# Patient Record
Sex: Male | Born: 1975 | Race: Black or African American | Hispanic: No | Marital: Married | Smoking: Never smoker
Health system: Southern US, Community
[De-identification: ages and names within clinical notes are randomized; demographics above are authoritative.]

---

## 1998-12-01 ENCOUNTER — Emergency Department (HOSPITAL_COMMUNITY): Admission: EM | Admit: 1998-12-01 | Discharge: 1998-12-01 | Payer: Self-pay | Admitting: Emergency Medicine

## 1998-12-02 ENCOUNTER — Encounter: Payer: Self-pay | Admitting: Emergency Medicine

## 1998-12-04 ENCOUNTER — Emergency Department (HOSPITAL_COMMUNITY): Admission: EM | Admit: 1998-12-04 | Discharge: 1998-12-04 | Payer: Self-pay | Admitting: Emergency Medicine

## 2004-01-08 ENCOUNTER — Emergency Department (HOSPITAL_COMMUNITY): Admission: EM | Admit: 2004-01-08 | Discharge: 2004-01-08 | Payer: Self-pay | Admitting: Emergency Medicine

## 2004-02-11 ENCOUNTER — Emergency Department (HOSPITAL_COMMUNITY): Admission: EM | Admit: 2004-02-11 | Discharge: 2004-02-12 | Payer: Self-pay

## 2006-02-10 ENCOUNTER — Emergency Department (HOSPITAL_COMMUNITY): Admission: EM | Admit: 2006-02-10 | Discharge: 2006-02-10 | Payer: Self-pay | Admitting: Emergency Medicine

## 2007-10-05 ENCOUNTER — Emergency Department (HOSPITAL_COMMUNITY): Admission: EM | Admit: 2007-10-05 | Discharge: 2007-10-05 | Payer: Self-pay | Admitting: Emergency Medicine

## 2009-06-06 ENCOUNTER — Emergency Department (HOSPITAL_COMMUNITY): Admission: EM | Admit: 2009-06-06 | Discharge: 2009-06-06 | Payer: Self-pay | Admitting: Emergency Medicine

## 2011-02-15 ENCOUNTER — Emergency Department (HOSPITAL_COMMUNITY)
Admission: EM | Admit: 2011-02-15 | Discharge: 2011-02-16 | Disposition: A | Discharge status: Home / Self Care | Payer: Self-pay | Attending: Emergency Medicine | Admitting: Emergency Medicine

## 2011-02-15 ENCOUNTER — Emergency Department (HOSPITAL_COMMUNITY): Payer: Self-pay

## 2011-02-15 DIAGNOSIS — F17200 Nicotine dependence, unspecified, uncomplicated: Secondary | ICD-10-CM | POA: Insufficient documentation

## 2011-02-15 DIAGNOSIS — R000 Tachycardia, unspecified: Secondary | ICD-10-CM | POA: Insufficient documentation

## 2011-02-15 DIAGNOSIS — R062 Wheezing: Secondary | ICD-10-CM | POA: Insufficient documentation

## 2011-02-15 DIAGNOSIS — R0602 Shortness of breath: Secondary | ICD-10-CM | POA: Insufficient documentation

## 2011-10-08 ENCOUNTER — Emergency Department (HOSPITAL_COMMUNITY)
Admission: EM | Admit: 2011-10-08 | Discharge: 2011-10-08 | Disposition: A | Discharge status: Home / Self Care | Payer: Self-pay | Attending: Emergency Medicine | Admitting: Emergency Medicine

## 2011-10-08 ENCOUNTER — Emergency Department (HOSPITAL_COMMUNITY): Payer: Self-pay

## 2011-10-08 DIAGNOSIS — M79609 Pain in unspecified limb: Secondary | ICD-10-CM | POA: Insufficient documentation

## 2011-10-08 DIAGNOSIS — M79675 Pain in left toe(s): Secondary | ICD-10-CM

## 2011-10-08 DIAGNOSIS — R609 Edema, unspecified: Secondary | ICD-10-CM | POA: Insufficient documentation

## 2011-10-08 MED ORDER — OXYCODONE-ACETAMINOPHEN 5-325 MG PO TABS
1.0000 | ORAL_TABLET | Freq: Once | ORAL | Status: AC
  Administered 2011-10-08: 1 via ORAL
  Filled 2011-10-08: qty 1

## 2011-10-08 MED ORDER — OXYCODONE-ACETAMINOPHEN 5-325 MG PO TABS: 2.0000 | ORAL_TABLET | ORAL | Status: AC | PRN

## 2011-10-08 NOTE — ED Provider Notes (Signed)
History     CSN: [id number] Arrival date & time: 10/08/2011  6:46 AM   First MD Initiated Contact with Patient 10/08/11 [phone]      Chief Complaint  Patient presents with  . Toe Pain    (Consider location/radiation/quality/duration/timing/severity/associated sxs/prior treatment) Patient is a 35 y.o. male presenting with toe pain. The history is provided by the patient and the spouse.  Toe Pain   the patient is a 35 year old, male, who complains of left toe pain.  He says that he hyperflexed it.  He denies any other injuries.  He has not taken any medication for it.  History reviewed. No pertinent past medical history.  History reviewed. No pertinent past surgical history.  History reviewed. No pertinent family history.  History  Substance Use Topics  . Smoking status: Never Smoker   . Smokeless tobacco: Not on file  . Alcohol Use: No      Review of Systems  Musculoskeletal:       Left toe pain  Neurological: Negative for weakness.  Hematological: Does not bruise/bleed easily.    Allergies  Penicillins  Home Medications  No current outpatient prescriptions on file.  BP 111/64  Pulse 70  Temp(Src) 98.7 F (37.1 C) (Oral)  Resp 20  SpO2 99%  Physical Exam  Constitutional: He is oriented to person, place, and time. He appears well-developed and well-nourished.  HENT:  Head: Normocephalic and atraumatic.  Eyes: Pupils are equal, round, and reactive to light.  Neck: Normal range of motion.  Musculoskeletal:       Left great toe tender to palpation extending over the first metatarsal head.  No deformity.  1+ edema.  Mild tenderness to palpation.  No ecchymoses  Neurological: He is alert and oriented to person, place, and time.  Skin: Skin is warm and dry.  Psychiatric: He has a normal mood and affect.    ED Course  Procedures (including critical care time) Minor trauma to left great toe.  We'll do an x-ray to determine whether or not.  There is a  fracture  Labs Reviewed - No data to display No results found.   No diagnosis found.    MDM  Left great toe pain Sprain left great toe        Nicholes Stairs, MD 10/08/11 [phone]

## 2011-10-08 NOTE — ED Notes (Signed)
Pt in from home with c/o left great toe pain states injured last night pt states stepped on a toy pt states pain and swelling to the area no difficulty ambulating

## 2014-08-26 ENCOUNTER — Encounter (HOSPITAL_COMMUNITY): Payer: Self-pay | Admitting: Emergency Medicine

## 2014-08-26 ENCOUNTER — Emergency Department (HOSPITAL_COMMUNITY)
Admission: EM | Admit: 2014-08-26 | Discharge: 2014-08-26 | Disposition: A | Discharge status: Home / Self Care | Payer: Self-pay | Attending: Emergency Medicine | Admitting: Emergency Medicine

## 2014-08-26 DIAGNOSIS — Z880 Allergy status to penicillin: Secondary | ICD-10-CM | POA: Insufficient documentation

## 2014-08-26 DIAGNOSIS — R0789 Other chest pain: Secondary | ICD-10-CM | POA: Insufficient documentation

## 2014-08-26 DIAGNOSIS — R062 Wheezing: Secondary | ICD-10-CM | POA: Insufficient documentation

## 2014-08-26 MED ORDER — PREDNISONE 10 MG PO TABS: 20.0000 mg | ORAL_TABLET | Freq: Every day | ORAL | Status: DC

## 2014-08-26 MED ORDER — IPRATROPIUM-ALBUTEROL 0.5-2.5 (3) MG/3ML IN SOLN
3.0000 mL | Freq: Once | RESPIRATORY_TRACT | Status: AC
  Administered 2014-08-26: 3 mL via RESPIRATORY_TRACT
  Filled 2014-08-26: qty 3

## 2014-08-26 MED ORDER — ALBUTEROL SULFATE HFA 108 (90 BASE) MCG/ACT IN AERS
2.0000 | INHALATION_SPRAY | RESPIRATORY_TRACT | Status: DC | PRN
  Administered 2014-08-26: 2 via RESPIRATORY_TRACT
  Filled 2014-08-26: qty 6.7

## 2014-08-26 MED ORDER — PREDNISONE 20 MG PO TABS
60.0000 mg | ORAL_TABLET | Freq: Once | ORAL | Status: AC
  Administered 2014-08-26: 60 mg via ORAL
  Filled 2014-08-26: qty 3

## 2014-08-26 MED ORDER — IPRATROPIUM-ALBUTEROL 0.5-2.5 (3) MG/3ML IN SOLN: 3.0000 mL | RESPIRATORY_TRACT | Status: DC

## 2014-08-26 NOTE — ED Notes (Signed)
As I assume his care, he has had h.h.n. Treatment which he states made him feel "a lot better".

## 2014-08-26 NOTE — ED Notes (Signed)
Pt c/o intermittent wheezing since 2013.  Has never been dx w/ asthma but thinks that he may have it.  Pt c/o wheezing since yesterday while he was at work.  Works at [employer] where he is exposed to a lot of dust.  Pt in NAD.  Speaking in full sentences.

## 2014-08-26 NOTE — ED Provider Notes (Signed)
CSN: [id number]     Arrival date & time 08/26/14  [id number] History   First MD Initiated Contact with Patient 08/26/14 1016     Chief Complaint  Patient presents with  . Asthma     (Consider location/radiation/quality/duration/timing/severity/associated sxs/prior Treatment) HPI Pt is a 38yo male presenting to ED with c/o worsening chest tightness, wheeze, and dry cough since yesterday. 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 Denies wearing a face mask while working.  States he has used his dad's albuterol inhaler in the past which does seem to help.  Pt was seen in ED in 2012 for similar complaints. Wheeze has been intermittent since then. States at times wheeze gets so bad at night it wakes him from his sleep and he has to drink water and walk around to help him catch his breath.  Denies fever, n/v/d. Denies sick contacts or recent travel.  History reviewed. No pertinent past medical history. No past surgical history on file. No family history on file. History  Substance Use Topics  . Smoking status: Never Smoker   . Smokeless tobacco: Not on file  . Alcohol Use: No    Review of Systems  Constitutional: Negative for fever and chills.  HENT: Negative for congestion and sore throat.   Respiratory: Positive for cough ( dry), chest tightness, shortness of breath and wheezing. Negative for stridor.   Cardiovascular: Negative for chest pain and palpitations.  Gastrointestinal: Negative for nausea, vomiting, abdominal pain and diarrhea.  All other systems reviewed and are negative.     Allergies  Penicillins  Home Medications   Prior to Admission medications   Medication Sig Start Date End Date Taking? Authorizing Provider  predniSONE (DELTASONE) 10 MG tablet Take 2 tablets (20 mg total) by mouth daily. 08/26/14   Junius FinnerErin O'Malley, PA-C   BP 109/77  Pulse 66  Temp(Src)  97.8 F (36.6 C) (Oral)  Resp 20  SpO2 97% Physical Exam  Nursing note and vitals reviewed. Constitutional: He appears well-developed and well-nourished.  Pt sitting in exam chair, NAD. Non-toxic appearing.  HENT:  Head: Normocephalic and atraumatic.  Eyes: Conjunctivae are normal. No scleral icterus.  Neck: Normal range of motion. Neck supple.  Cardiovascular: Normal rate, regular rhythm and normal heart sounds.   Regular rate and rhythm  Pulmonary/Chest: Effort normal. No respiratory distress. He has wheezes. He has no rales. He exhibits no tenderness.  No respiratory distress, able to speak in full sentences w/o difficulty. No accessory muscle use.  Diffuse expiratory wheeze. No rhonchi.   Abdominal: Soft. Bowel sounds are normal. He exhibits no distension and no mass. There is no tenderness. There is no rebound and no guarding.  Musculoskeletal: Normal range of motion.  Neurological: He is alert.  Skin: Skin is warm and dry.    ED Course  Procedures (including critical care time) Labs Review Labs Reviewed - No data to display  Imaging Review No results found.   EKG Interpretation None      MDM   Final diagnoses:  Wheeze    Pt presenting to ED with c/o chest tightness, dry cough and wheeze. Works around dust.  Denies fever, n/v/d. O2-97% on RA. Doubt pneumonia or PE.  Will tx with prednisone and duoneb in ED, reevaluate.  Do not believe CXR needed at this time.   Wheeze did improve after tx in ED.  Pt states he feels better.  Will discharge home with albuterol inhaler and prednisone. Ad[facility] and continued tx of recurrent wheeze. Return precautions provided. Pt verbalized understanding and agreement with tx plan.     Junius Finnerrin O'Malley, PA-C 08/26/14 1222

## 2014-08-26 NOTE — Discharge Instructions (Signed)
Metered Dose Inhaler (No Spacer Used)  Inhaled medicines are the basis of treatment for asthma and other breathing problems. Inhaled medicine can only be effective if used properly. Good technique assures that the medicine reaches the lungs.  Metered dose inhalers (MDIs) are used to deliver a variety of inhaled medicines. These include quick relief or rescue medicines (such as bronchodilators) and controller medicines (such as corticosteroids). The medicine is delivered by pushing down on a metal canister to release a set amount of spray.  If you are using different kinds of inhalers, use your quick relief medicine to open the airways 10-15 minutes before using a steroid, if instructed to do so by your health care provider. If you are unsure which inhalers to use and the order of using them, ask your health care provider, nurse, or respiratory therapist.  HOW TO USE THE INHALER  1. Remove the cap from the inhaler.  2. If you are using the inhaler for the first time, you will need to prime it. Shake the inhaler for 5 seconds and release four puffs into the air, away from your face. Ask your health care provider or pharmacist if you have questions about priming your inhaler.  3. Shake the inhaler for 5 seconds before each breath in (inhalation).  4. Position the inhaler so that the top of the canister faces up.  5. Put your index finger on the top of the medicine canister. Your thumb supports the bottom of the inhaler.  6. Open your mouth.  7. Either place the inhaler between your teeth and place your lips tightly around the mouthpiece, or hold the inhaler 1-2 inches away from your open mouth. If you are unsure of which technique to use, ask your health care provider.  8. Breathe out (exhale) normally and as completely as possible.  9. Press the canister down with the index finger to release the medicine.  10. At the same time as the canister is pressed, inhale deeply and slowly until your lungs are completely filled.  This should take 4-6 seconds. Keep your tongue down.  11. Hold the medicine in your lungs for 5-10 seconds (10 seconds is best). This helps the medicine get into the small airways of your lungs.  12. Breathe out slowly, through pursed lips. Whistling is an example of pursed lips.  13. Wait at least 1 minute between puffs. Continue with the above steps until you have taken the number of puffs your health care provider has ordered. Do not use the inhaler more than your health care provider directs you to.  14. Replace the cap on the inhaler.  15. Follow the directions from your health care provider or the inhaler insert for cleaning the inhaler.  If you are using a steroid inhaler, after your last puff, rinse your mouth with water, gargle, and spit out the water. Do not swallow the water.  AVOID:  · Inhaling before or after starting the spray of medicine. It takes practice to coordinate your breathing with triggering the spray.  · Inhaling through the nose (rather than the mouth) when triggering the spray.  HOW TO DETERMINE IF YOUR INHALER IS FULL OR NEARLY EMPTY  You cannot know when an inhaler is empty by shaking it. Some inhalers are now being made with dose counters. Ask your health care provider for a prescription that has a dose counter if you feel you need that extra help. If your inhaler does not have a counter, ask your health care   provider to help you determine the date you need to refill your inhaler. Write the refill date on a calendar or your inhaler canister. Refill your inhaler 7-10 days before it runs out. Be sure to keep an adequate supply of medicine. This includes making sure it has not expired, and making sure you have a spare inhaler.  SEEK MEDICAL CARE IF:  · Symptoms are only partially relieved with your inhaler.  · You are having trouble using your inhaler.  · You experience an increase in phlegm.  SEEK IMMEDIATE MEDICAL CARE IF:  · You feel little or no relief with your inhalers. You are still  wheezing and feeling shortness of breath, tightness in your chest, or both.  · You have dizziness, headaches, or a fast heart rate.  · You have chills, fever, or night sweats.  · There is a noticeable increase in phlegm production, or there is blood in the phlegm.  MAKE SURE YOU:  · Understand these instructions.  · Will watch your condition.  · Will get help right away if you are not doing well or get worse.  Document Released: 08/20/2007 Document Revised: 03/09/2014 Document Reviewed: 04/10/2013  [employer]® Patient Information ©2015 [employer]. This information is not intended to replace advice given to you by your health care provider. Make sure you discuss any questions you have with your health care provider.

## 2014-08-31 NOTE — ED Provider Notes (Signed)
Medical screening examination/treatment/procedure(s) were performed by non-physician practitioner and as supervising physician I was immediately available for consultation/collaboration.   EKG Interpretation None       Arby BarretteMarcy Mylea Roarty, MD 08/31/14 1544

## 2016-08-10 ENCOUNTER — Ambulatory Visit (HOSPITAL_COMMUNITY)
Admission: EM | Admit: 2016-08-10 | Discharge: 2016-08-10 | Disposition: A | Discharge status: Home / Self Care | Payer: Self-pay | Attending: Internal Medicine | Admitting: Internal Medicine

## 2016-08-10 ENCOUNTER — Encounter (HOSPITAL_COMMUNITY): Payer: Self-pay | Admitting: Emergency Medicine

## 2016-08-10 DIAGNOSIS — J111 Influenza due to unidentified influenza virus with other respiratory manifestations: Secondary | ICD-10-CM

## 2016-08-10 DIAGNOSIS — R69 Illness, unspecified: Secondary | ICD-10-CM

## 2016-08-10 DIAGNOSIS — K0889 Other specified disorders of teeth and supporting structures: Secondary | ICD-10-CM

## 2016-08-10 MED ORDER — OSELTAMIVIR PHOSPHATE 75 MG PO CAPS: 75.0000 mg | ORAL_CAPSULE | Freq: Two times a day (BID) | ORAL | 0 refills | Status: DC

## 2016-08-10 NOTE — Discharge Instructions (Signed)
Take the Tamiflu as directed. Ibuprofen 600 mg every 6 hours as needed. Drink plenty of fluids and stay well-hydrated. Rest. No work until you have had no fever for 24 hours.

## 2016-08-10 NOTE — ED Triage Notes (Signed)
Pt has been suffering from a headache, chills, fever, a toothache and a fever since yesterday.  Pt took 800 mg of ibuprofen this morning and now has a fever of 102.8.

## 2016-08-10 NOTE — ED Provider Notes (Signed)
CSN: [id number]     Arrival date & time 08/10/16  1536 History   First MD Initiated Contact with Patient 08/10/16 1714     Chief Complaint  Patient presents with  . Fever  . Generalized Body Aches  . Dental Pain    right lower   (Consider location/radiation/quality/duration/timing/severity/associated sxs/prior Treatment) 40 year old male states that yesterday evening he developed fatigue, malaise, myalgias, decreased appetite and fever. Just a short time prior to this he developed relatively mild/moderate toothache in the right lower row of teeth. Current temperature now 102.8. Denies GI symptoms.      History reviewed. No pertinent past medical history. History reviewed. No pertinent surgical history. History reviewed. No pertinent family history. Social History  Substance Use Topics  . Smoking status: Never Smoker  . Smokeless tobacco: Former NeurosurgeonUser  . Alcohol use Yes     Comment: occasional    Review of Systems  Constitutional: Positive for activity change, appetite change, chills, fatigue and fever.  HENT: Positive for dental problem. Negative for congestion, postnasal drip, rhinorrhea and sore throat.   Respiratory: Positive for cough. Negative for chest tightness and shortness of breath.   Cardiovascular: Negative for chest pain.  Gastrointestinal: Negative.   Genitourinary: Negative.   Skin: Negative.  Negative for rash.  Neurological: Negative.     Allergies  Penicillins  Home Medications   Prior to Admission medications   Medication Sig Start Date End Date Taking? Authorizing Provider  oseltamivir (TAMIFLU) 75 MG capsule Take 1 capsule (75 mg total) by mouth 2 (two) times daily. X 5 days 08/10/16   Hayden Rasmussenavid Zoran Yankee, NP  predniSONE (DELTASONE) 10 MG tablet Take 2 tablets (20 mg total) by mouth daily. 08/26/14   Junius FinnerErin O'Malley, PA-C   Meds Ordered and Administered this Visit  Medications - No data to display  BP 107/60 (BP Location: Left Arm)   Pulse 102   Temp  102.8 F (39.3 C) (Oral)   Resp 12   SpO2 100%  No data found.   Physical Exam  Constitutional: He is oriented to person, place, and time. He appears well-developed and well-nourished. No distress.  HENT:  Head: Normocephalic and atraumatic.  Mouth/Throat: No oropharyngeal exudate.  Bilateral TMs are normal. Oropharynx erythematous but without exudates or drainage.  Neck: Normal range of motion. Neck supple.  Cardiovascular: Normal rate.   Pulmonary/Chest: Effort normal and breath sounds normal. No respiratory distress.  Abdominal: Soft. There is no tenderness.  Musculoskeletal: Normal range of motion. He exhibits no edema.  Lymphadenopathy:    He has no cervical adenopathy.  Neurological: He is alert and oriented to person, place, and time.  Skin: Skin is warm and dry.  Nursing note and vitals reviewed.   Urgent Care Course   Clinical Course    Procedures (including critical care time)  Labs Review Labs Reviewed - No data to display  Imaging Review No results found.   Visual Acuity Review  Right Eye Distance:   Left Eye Distance:   Bilateral Distance:    Right Eye Near:   Left Eye Near:    Bilateral Near:         MDM   1. Influenza-like illness   2. Pain, dental    Take the Tamiflu as directed. Ibuprofen 600 mg every 6 hours as needed. Drink plenty of fluids and stay well-hydrated. Rest. No work until you have had no fever for 24 hours. Meds ordered this encounter  Medications  . oseltamivir (TAMIFLU) 75 MG capsule  Sig: Take 1 capsule (75 mg total) by mouth 2 (two) times daily. X 5 days    Dispense:  10 capsule    Refill:  0    Order Specific Question:   Supervising Provider    Answer:   Eustace Moore [[id number]]       Hayden Rasmussen, NP 08/10/16 1731

## 2016-09-30 ENCOUNTER — Emergency Department (HOSPITAL_COMMUNITY): Payer: No Typology Code available for payment source

## 2016-09-30 ENCOUNTER — Encounter (HOSPITAL_COMMUNITY): Payer: Self-pay | Admitting: Emergency Medicine

## 2016-09-30 ENCOUNTER — Emergency Department (HOSPITAL_COMMUNITY)
Admission: EM | Admit: 2016-09-30 | Discharge: 2016-10-01 | Disposition: A | Discharge status: Home / Self Care | Payer: No Typology Code available for payment source | Attending: Emergency Medicine | Admitting: Emergency Medicine

## 2016-09-30 DIAGNOSIS — Y92410 Unspecified street and highway as the place of occurrence of the external cause: Secondary | ICD-10-CM | POA: Insufficient documentation

## 2016-09-30 DIAGNOSIS — Y999 Unspecified external cause status: Secondary | ICD-10-CM | POA: Insufficient documentation

## 2016-09-30 DIAGNOSIS — Y939 Activity, unspecified: Secondary | ICD-10-CM | POA: Diagnosis not present

## 2016-09-30 DIAGNOSIS — S301XXA Contusion of abdominal wall, initial encounter: Secondary | ICD-10-CM | POA: Diagnosis not present

## 2016-09-30 DIAGNOSIS — K7689 Other specified diseases of liver: Secondary | ICD-10-CM | POA: Insufficient documentation

## 2016-09-30 DIAGNOSIS — M25531 Pain in right wrist: Secondary | ICD-10-CM | POA: Diagnosis not present

## 2016-09-30 DIAGNOSIS — K769 Liver disease, unspecified: Secondary | ICD-10-CM

## 2016-09-30 DIAGNOSIS — S3991XA Unspecified injury of abdomen, initial encounter: Secondary | ICD-10-CM | POA: Diagnosis present
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
## 2016-10-01 ENCOUNTER — Emergency Department (HOSPITAL_COMMUNITY): Payer: No Typology Code available for payment source

## 2016-10-01 LAB — CBC WITH DIFFERENTIAL/PLATELET
BASOS PCT: 0 %
Basophils Absolute: 0 10*3/uL (ref 0.0–0.1)
Eosinophils Absolute: 0.3 10*3/uL (ref 0.0–0.7)
Eosinophils Relative: 3 %
HCT: 40.6 % (ref 39.0–52.0)
HEMOGLOBIN: 14.4 g/dL (ref 13.0–17.0)
LYMPHS ABS: 1.5 10*3/uL (ref 0.7–4.0)
Lymphocytes Relative: 16 %
MCH: 31.8 pg (ref 26.0–34.0)
MCHC: 35.5 g/dL (ref 30.0–36.0)
MCV: 89.6 fL (ref 78.0–100.0)
MONOS PCT: 7 %
Monocytes Absolute: 0.6 10*3/uL (ref 0.1–1.0)
NEUTROS ABS: 6.8 10*3/uL (ref 1.7–7.7)
NEUTROS PCT: 74 %
Platelets: 267 10*3/uL (ref 150–400)
RBC: 4.53 MIL/uL (ref 4.22–5.81)
RDW: 12.4 % (ref 11.5–15.5)
WBC: 9.2 10*3/uL (ref 4.0–10.5)

## 2016-10-01 LAB — COMPREHENSIVE METABOLIC PANEL
ALT: 18 U/L (ref 17–63)
AST: 27 U/L (ref 15–41)
Albumin: 3.8 g/dL (ref 3.5–5.0)
Alkaline Phosphatase: 84 U/L (ref 38–126)
Anion gap: 9 (ref 5–15)
BUN: 13 mg/dL (ref 6–20)
CHLORIDE: 105 mmol/L (ref 101–111)
CO2: 24 mmol/L (ref 22–32)
Calcium: 8.8 mg/dL — ABNORMAL LOW (ref 8.9–10.3)
Creatinine, Ser: 0.92 mg/dL (ref 0.61–1.24)
Glucose, Bld: 79 mg/dL (ref 65–99)
Potassium: 4.1 mmol/L (ref 3.5–5.1)
SODIUM: 138 mmol/L (ref 135–145)
Total Bilirubin: 0.5 mg/dL (ref 0.3–1.2)
Total Protein: 6.2 g/dL — ABNORMAL LOW (ref 6.5–8.1)

## 2016-10-01 MED ORDER — IOPAMIDOL (ISOVUE-300) INJECTION 61%
INTRAVENOUS | Status: AC
  Administered 2016-10-01: 100 mL
  Filled 2016-10-01: qty 100

## 2016-10-01 MED ORDER — TRAMADOL HCL 50 MG PO TABS: 50.0000 mg | ORAL_TABLET | Freq: Four times a day (QID) | ORAL | 0 refills | Status: AC | PRN

## 2016-10-01 MED ORDER — MORPHINE SULFATE (PF) 4 MG/ML IV SOLN
4.0000 mg | Freq: Once | INTRAVENOUS | Status: AC
  Administered 2016-10-01: 4 mg via INTRAVENOUS
  Filled 2016-10-01: qty 1
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
## 2016-10-01 NOTE — ED Notes (Signed)
Per lab unable to locate blood that was sent at [facility]

## 2016-10-01 NOTE — ED Notes (Signed)
Pt reports pain with certain positions in abd. Other wise okay

## 2016-10-01 NOTE — Discharge Instructions (Signed)
Take acetaminophen or ibuprofen as needed for less severe pain. °

## 2016-10-04 ENCOUNTER — Telehealth: Payer: Self-pay | Admitting: General Practice

## 2016-10-04 NOTE — Telephone Encounter (Signed)
APT. REMINDER CALL, PHONE NOT IN SERVICE °

## 2016-10-05 ENCOUNTER — Ambulatory Visit (INDEPENDENT_AMBULATORY_CARE_PROVIDER_SITE_OTHER): Payer: Self-pay | Admitting: Internal Medicine

## 2016-10-05 ENCOUNTER — Telehealth: Payer: Self-pay | Admitting: *Deleted

## 2016-10-05 VITALS — BP 121/67 | HR 54 | Temp 97.7°F | Ht 68.0 in | Wt 155.4 lb

## 2016-10-05 DIAGNOSIS — K769 Liver disease, unspecified: Secondary | ICD-10-CM | POA: Insufficient documentation

## 2016-10-05 DIAGNOSIS — M25531 Pain in right wrist: Secondary | ICD-10-CM | POA: Insufficient documentation

## 2016-10-05 DIAGNOSIS — Z87891 Personal history of nicotine dependence: Secondary | ICD-10-CM

## 2016-10-05 DIAGNOSIS — K7689 Other specified diseases of liver: Secondary | ICD-10-CM

## 2016-10-05 NOTE — Telephone Encounter (Signed)
Call to patient to make him aware that he needs lab work done.  Message was left on patient's answering machine to call the Clinics.  Angelina OkGladys Shalon Salado, RN 10/05/2016 10:43 AM.
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
## 2016-10-05 NOTE — Patient Instructions (Signed)
Mr. Carlos Johnston,  He was a pleasure meeting you today. Please use your wrist brace for 2-4 weeks during the day. An MRI has been ordered to further assess your liver Please call the clinic if you have any questions or concerns

## 2016-10-08 NOTE — Progress Notes (Signed)
Internal Medicine Clinic Attending  I saw and evaluated the patient.  I personally confirmed the key portions of the history and exam documented by Dr. Hoffman and I reviewed pertinent patient test results.  The assessment, diagnosis, and plan were formulated together and I agree with the documentation in the resident's note.      

## 2016-10-17 ENCOUNTER — Ambulatory Visit (HOSPITAL_COMMUNITY): Payer: No Typology Code available for payment source

## 2016-11-15 ENCOUNTER — Ambulatory Visit (HOSPITAL_COMMUNITY): Admission: RE | Admit: 2016-11-15 | Payer: BLUE CROSS/BLUE SHIELD | Source: Ambulatory Visit

## 2016-12-21 NOTE — Addendum Note (Signed)
Addended by: Bufford SpikesFULCHER, Amery Vandenbos N on: 12/21/2016 02:25 PM   Modules accepted: Orders
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
## 2019-11-06 ENCOUNTER — Other Ambulatory Visit: Payer: Self-pay

## 2019-11-06 DIAGNOSIS — Z20822 Contact with and (suspected) exposure to covid-19: Secondary | ICD-10-CM

## 2019-11-08 ENCOUNTER — Telehealth (HOSPITAL_COMMUNITY): Payer: Self-pay | Admitting: Critical Care Medicine

## 2019-11-08 LAB — NOVEL CORONAVIRUS, NAA: SARS-CoV-2, NAA: DETECTED — AB

## 2019-11-08 NOTE — Telephone Encounter (Signed)
I connected with this patient is Covid positive from December 31.  He is having flulike illness.  He has not a monoclonal antibody candidate.  He knows to stay in isolation until January 9.
